# Patient Record
Sex: Male | Born: 1970 | Race: White | Hispanic: No | Marital: Married | Smoking: Never smoker
Health system: Southern US, Community
[De-identification: ages and names within clinical notes are randomized; demographics above are authoritative.]

## PROBLEM LIST (undated history)

## (undated) HISTORY — PX: CHEST SURGERY: SHX595

## (undated) HISTORY — PX: APPENDECTOMY: SHX54

---

## 2002-05-31 ENCOUNTER — Ambulatory Visit (HOSPITAL_BASED_OUTPATIENT_CLINIC_OR_DEPARTMENT_OTHER): Admission: RE | Admit: 2002-05-31 | Discharge: 2002-05-31 | Payer: Self-pay | Admitting: Orthopedic Surgery

## 2015-02-27 ENCOUNTER — Emergency Department (HOSPITAL_COMMUNITY): Payer: Self-pay

## 2015-02-27 ENCOUNTER — Encounter (HOSPITAL_COMMUNITY): Payer: Self-pay | Admitting: Emergency Medicine

## 2015-02-27 ENCOUNTER — Emergency Department (HOSPITAL_COMMUNITY)
Admission: EM | Admit: 2015-02-27 | Discharge: 2015-02-27 | Disposition: A | Discharge status: Home / Self Care | Payer: Self-pay | Attending: Emergency Medicine | Admitting: Emergency Medicine

## 2015-02-27 DIAGNOSIS — R079 Chest pain, unspecified: Secondary | ICD-10-CM | POA: Insufficient documentation

## 2015-02-27 DIAGNOSIS — Z9889 Other specified postprocedural states: Secondary | ICD-10-CM | POA: Insufficient documentation

## 2015-02-27 DIAGNOSIS — R0602 Shortness of breath: Secondary | ICD-10-CM | POA: Insufficient documentation

## 2015-02-27 LAB — CBC
HCT: 43.6 % (ref 39.0–52.0)
HEMOGLOBIN: 15 g/dL (ref 13.0–17.0)
MCH: 31.4 pg (ref 26.0–34.0)
MCHC: 34.4 g/dL (ref 30.0–36.0)
MCV: 91.2 fL (ref 78.0–100.0)
PLATELETS: 172 10*3/uL (ref 150–400)
RBC: 4.78 MIL/uL (ref 4.22–5.81)
RDW: 12.4 % (ref 11.5–15.5)
WBC: 9.2 10*3/uL (ref 4.0–10.5)

## 2015-02-27 LAB — BASIC METABOLIC PANEL
ANION GAP: 8 (ref 5–15)
BUN: 17 mg/dL (ref 6–20)
CALCIUM: 9.2 mg/dL (ref 8.9–10.3)
CO2: 27 mmol/L (ref 22–32)
CREATININE: 0.83 mg/dL (ref 0.61–1.24)
Chloride: 101 mmol/L (ref 101–111)
Glucose, Bld: 122 mg/dL — ABNORMAL HIGH (ref 65–99)
Potassium: 3.8 mmol/L (ref 3.5–5.1)
SODIUM: 136 mmol/L (ref 135–145)

## 2015-02-27 LAB — D-DIMER, QUANTITATIVE (NOT AT ARMC)

## 2015-02-27 LAB — TROPONIN I

## 2015-02-27 MED ORDER — OMEPRAZOLE 20 MG PO CPDR: 20.0000 mg | DELAYED_RELEASE_CAPSULE | Freq: Every day | ORAL | Status: DC

## 2015-02-27 NOTE — ED Notes (Signed)
Pt c/o sharp chest pain when he takes a deep breath.

## 2015-02-27 NOTE — Discharge Instructions (Signed)
Nonspecific Chest Pain  °Chest pain can be caused by many different conditions. There is always a chance that your pain could be related to something serious, such as a heart attack or a blood clot in your lungs. Chest pain can also be caused by conditions that are not life-threatening. If you have chest pain, it is very important to follow up with your health care provider. °CAUSES  °Chest pain can be caused by: °· Heartburn. °· Pneumonia or bronchitis. °· Anxiety or stress. °· Inflammation around your heart (pericarditis) or lung (pleuritis or pleurisy). °· A blood clot in your lung. °· A collapsed lung (pneumothorax). It can develop suddenly on its own (spontaneous pneumothorax) or from trauma to the chest. °· Shingles infection (varicella-zoster virus). °· Heart attack. °· Damage to the bones, muscles, and cartilage that make up your chest wall. This can include: °¨ Bruised bones due to injury. °¨ Strained muscles or cartilage due to frequent or repeated coughing or overwork. °¨ Fracture to one or more ribs. °¨ Sore cartilage due to inflammation (costochondritis). °RISK FACTORS  °Risk factors for chest pain may include: °· Activities that increase your risk for trauma or injury to your chest. °· Respiratory infections or conditions that cause frequent coughing. °· Medical conditions or overeating that can cause heartburn. °· Heart disease or family history of heart disease. °· Conditions or health behaviors that increase your risk of developing a blood clot. °· Having had chicken pox (varicella zoster). °SIGNS AND SYMPTOMS °Chest pain can feel like: °· Burning or tingling on the surface of your chest or deep in your chest. °· Crushing, pressure, aching, or squeezing pain. °· Dull or sharp pain that is worse when you move, cough, or take a deep breath. °· Pain that is also felt in your back, neck, shoulder, or arm, or pain that spreads to any of these areas. °Your chest pain may come and go, or it may stay  constant. °DIAGNOSIS °Lab tests or other studies may be needed to find the cause of your pain. Your health care provider may have you take a test called an ambulatory ECG (electrocardiogram). An ECG records your heartbeat patterns at the time the test is performed. You may also have other tests, such as: °· Transthoracic echocardiogram (TTE). During echocardiography, sound waves are used to create a picture of all of the heart structures and to look at how blood flows through your heart. °· Transesophageal echocardiogram (TEE). This is a more advanced imaging test that obtains images from inside your body. It allows your health care provider to see your heart in finer detail. °· Cardiac monitoring. This allows your health care provider to monitor your heart rate and rhythm in real time. °· Holter monitor. This is a portable device that records your heartbeat and can help to diagnose abnormal heartbeats. It allows your health care provider to track your heart activity for several days, if needed. °· Stress tests. These can be done through exercise or by taking medicine that makes your heart beat more quickly. °· Blood tests. °· Imaging tests. °TREATMENT  °Your treatment depends on what is causing your chest pain. Treatment may include: °· Medicines. These may include: °¨ Acid blockers for heartburn. °¨ Anti-inflammatory medicine. °¨ Pain medicine for inflammatory conditions. °¨ Antibiotic medicine, if an infection is present. °¨ Medicines to dissolve blood clots. °¨ Medicines to treat coronary artery disease. °· Supportive care for conditions that do not require medicines. This may include: °¨ Resting. °¨ Applying heat   or cold packs to injured areas. °¨ Limiting activities until pain decreases. °HOME CARE INSTRUCTIONS °· If you were prescribed an antibiotic medicine, finish it all even if you start to feel better. °· Avoid any activities that bring on chest pain. °· Do not use any tobacco products, including  cigarettes, chewing tobacco, or electronic cigarettes. If you need help quitting, ask your health care provider. °· Do not drink alcohol. °· Take medicines only as directed by your health care provider. °· Keep all follow-up visits as directed by your health care provider. This is important. This includes any further testing if your chest pain does not go away. °· If heartburn is the cause for your chest pain, you may be told to keep your head raised (elevated) while sleeping. This reduces the chance that acid will go from your stomach into your esophagus. °· Make lifestyle changes as directed by your health care provider. These may include: °¨ Getting regular exercise. Ask your health care provider to suggest some activities that are safe for you. °¨ Eating a heart-healthy diet. A registered dietitian can help you to learn healthy eating options. °¨ Maintaining a healthy weight. °¨ Managing diabetes, if necessary. °¨ Reducing stress. °SEEK MEDICAL CARE IF: °· Your chest pain does not go away after treatment. °· You have a rash with blisters on your chest. °· You have a fever. °SEEK IMMEDIATE MEDICAL CARE IF:  °· Your chest pain is worse. °· You have an increasing cough, or you cough up blood. °· You have severe abdominal pain. °· You have severe weakness. °· You faint. °· You have chills. °· You have sudden, unexplained chest discomfort. °· You have sudden, unexplained discomfort in your arms, back, neck, or jaw. °· You have shortness of breath at any time. °· You suddenly start to sweat, or your skin gets clammy. °· You feel nauseous or you vomit. °· You suddenly feel light-headed or dizzy. °· Your heart begins to beat quickly, or it feels like it is skipping beats. °These symptoms may represent a serious problem that is an emergency. Do not wait to see if the symptoms will go away. Get medical help right away. Call your local emergency services (911 in the [place] Do not drive yourself to the hospital. °  °This  information is not intended to replace advice given to you by your health care provider. Make sure you discuss any questions you have with your health care provider. °  °Document Released: 10/15/2004 Document Revised: 01/26/2014 Document Reviewed: 08/11/2013 °Elsevier Interactive Patient Education ©2016 [employer] ° °

## 2015-02-27 NOTE — ED Provider Notes (Signed)
CSN: [id number]     Arrival date & time 02/27/15  2009 History   First MD Initiated Contact with Patient 02/27/15 2041     Chief Complaint  Patient presents with  . Chest Pain      Patient is a 45 y.o. male presenting with chest pain. The history is provided by the patient.  Chest Pain Associated symptoms: shortness of breath   Associated symptoms: no abdominal pain, no back pain, no cough, no headache, no nausea, no numbness, not vomiting and no weakness    patient presented with chest pain. Is sharp and in the mid chest. Worse with deep breath and does radiate to the back. No fevers or chills. No cough.  Pain is not with exertion. Not worse after eating. He has had one episode like this before. It resolved on its own. He drives trucks for a living.  He does not smoke.  History reviewed. No pertinent past medical history. Past Surgical History  Procedure Laterality Date  . Chest surgery     History reviewed. No pertinent family history. Social History  Substance Use Topics  . Smoking status: Never Smoker   . Smokeless tobacco: None  . Alcohol Use: No    Review of Systems  Constitutional: Negative for activity change and appetite change.  Eyes: Negative for pain.  Respiratory: Positive for shortness of breath. Negative for cough and chest tightness.   Cardiovascular: Positive for chest pain. Negative for leg swelling.  Gastrointestinal: Negative for nausea, vomiting, abdominal pain and diarrhea.  Genitourinary: Negative for flank pain.  Musculoskeletal: Negative for back pain and neck stiffness.  Skin: Negative for rash.  Neurological: Negative for weakness, numbness and headaches.  Psychiatric/Behavioral: Negative for behavioral problems.      Allergies  Review of patient's allergies indicates no known allergies.  Home Medications   Prior to Admission medications   Medication Sig Start Date End Date Taking? Authorizing Provider  omeprazole (PRILOSEC) 20 MG capsule  Take 1 capsule (20 mg total) by mouth daily. 02/27/15   Benjiman Core, MD   BP 120/85 mmHg  Pulse 70  Temp(Src) 98.3 F (36.8 C) (Oral)  Resp 18  Ht  (1.753 m)  Wt 160 lb (72.576 kg)  BMI 23.62 kg/m2  SpO2 98% Physical Exam  Constitutional: He is oriented to person, place, and time. He appears well-developed and well-nourished.  HENT:  Head: Normocephalic and atraumatic.  Eyes: EOM are normal. Pupils are equal, round, and reactive to light.  Neck: Normal range of motion. Neck supple.  Cardiovascular: Normal rate, regular rhythm and normal heart sounds.   Pulmonary/Chest: Effort normal and breath sounds normal.  Abdominal: Soft. Bowel sounds are normal. He exhibits no distension.  Musculoskeletal: Normal range of motion.  Neurological: He is alert and oriented to person, place, and time.  Skin: Skin is warm and dry.  Psychiatric: He has a normal mood and affect.  Nursing note and vitals reviewed.   ED Course  Procedures (including critical care time) Labs Review Labs Reviewed  BASIC METABOLIC PANEL - Abnormal; Notable for the following:    Glucose, Bld 122 (*)    All other components within normal limits  CBC  TROPONIN I  D-DIMER, QUANTITATIVE (NOT AT [facility])    Imaging Review Dg Chest 2 View  02/27/2015  CLINICAL DATA:  Acute chest pain. EXAM: CHEST  2 VIEW COMPARISON:  None. FINDINGS: The heart size and mediastinal contours are within normal limits. Both lungs are clear. No pneumothorax or  pleural effusion is noted. Pectus excavatum deformity is noted. IMPRESSION: No active cardiopulmonary disease. Electronically Signed   By: Lupita Raider, M.D.   On: 02/27/2015 21:16   I have personally reviewed and evaluated these images and lab results as part of my medical decision-making.   EKG Interpretation   Date/Time:  Wednesday February 27 2015 20:16:46 EST Ventricular Rate:  81 PR Interval:  140 QRS Duration: 104 QT Interval:  384 QTC Calculation: 446 R Axis:    72 Text Interpretation:  Normal sinus rhythm Biatrial enlargement Incomplete  right bundle branch block Left ventricular hypertrophy Abnormal ECG  Confirmed by Rubin Payor  MD, Harrold Donath [id number]) on 02/27/2015 8:43:04 PM      MDM   Final diagnoses:  Chest pain, unspecified chest pain type     patient with chest pain. EKG reassuring. Troponin negative. X-ray reassuring. Negative d-dimer. Will discharge home.    Benjiman Core, MD 02/27/15 [phone]

## 2018-03-19 ENCOUNTER — Encounter (HOSPITAL_COMMUNITY): Payer: Self-pay | Admitting: Emergency Medicine

## 2018-03-19 ENCOUNTER — Emergency Department (HOSPITAL_COMMUNITY): Payer: Self-pay

## 2018-03-19 ENCOUNTER — Emergency Department (HOSPITAL_COMMUNITY)
Admission: EM | Admit: 2018-03-19 | Discharge: 2018-03-20 | Disposition: A | Discharge status: Home / Self Care | Payer: Self-pay | Attending: Emergency Medicine | Admitting: Emergency Medicine

## 2018-03-19 ENCOUNTER — Other Ambulatory Visit: Payer: Self-pay

## 2018-03-19 DIAGNOSIS — J9801 Acute bronchospasm: Secondary | ICD-10-CM | POA: Insufficient documentation

## 2018-03-19 DIAGNOSIS — R0789 Other chest pain: Secondary | ICD-10-CM | POA: Insufficient documentation

## 2018-03-19 DIAGNOSIS — J209 Acute bronchitis, unspecified: Secondary | ICD-10-CM

## 2018-03-19 LAB — CBC
HEMATOCRIT: 45.4 % (ref 39.0–52.0)
HEMOGLOBIN: 15 g/dL (ref 13.0–17.0)
MCH: 29.8 pg (ref 26.0–34.0)
MCHC: 33 g/dL (ref 30.0–36.0)
MCV: 90.1 fL (ref 80.0–100.0)
NRBC: 0 % (ref 0.0–0.2)
PLATELETS: 221 10*3/uL (ref 150–400)
RBC: 5.04 MIL/uL (ref 4.22–5.81)
RDW: 12.6 % (ref 11.5–15.5)
WBC: 6.9 10*3/uL (ref 4.0–10.5)

## 2018-03-19 LAB — BASIC METABOLIC PANEL
ANION GAP: 8 (ref 5–15)
BUN: 15 mg/dL (ref 6–20)
CHLORIDE: 103 mmol/L (ref 98–111)
CO2: 27 mmol/L (ref 22–32)
Calcium: 9 mg/dL (ref 8.9–10.3)
Creatinine, Ser: 0.83 mg/dL (ref 0.61–1.24)
GFR calc non Af Amer: >60 mL/min (ref >60)
Glucose, Bld: 102 mg/dL — ABNORMAL HIGH (ref 70–99)
POTASSIUM: 3.6 mmol/L (ref 3.5–5.1)
Sodium: 138 mmol/L (ref 135–145)

## 2018-03-19 LAB — TROPONIN I: Troponin I: <0.03 ng/mL (ref <0.03)

## 2018-03-19 MED ORDER — LORATADINE 10 MG PO TABS: 10.0000 mg | ORAL_TABLET | Freq: Every day | ORAL | 0 refills | Status: DC

## 2018-03-19 MED ORDER — ALBUTEROL (5 MG/ML) CONTINUOUS INHALATION SOLN
10.0000 mg/h | INHALATION_SOLUTION | RESPIRATORY_TRACT | Status: DC
  Administered 2018-03-19: 10 mg/h via RESPIRATORY_TRACT
  Filled 2018-03-19: qty 20

## 2018-03-19 MED ORDER — PREDNISONE 20 MG PO TABS: ORAL_TABLET | ORAL | 0 refills | Status: DC

## 2018-03-19 MED ORDER — HYDROCODONE-ACETAMINOPHEN 5-325 MG PO TABS: 1.0000 | ORAL_TABLET | Freq: Four times a day (QID) | ORAL | 0 refills | Status: AC | PRN

## 2018-03-19 MED ORDER — PREDNISONE 50 MG PO TABS
60.0000 mg | ORAL_TABLET | Freq: Once | ORAL | Status: AC
Start: 2018-03-19 — End: 2018-03-19
  Administered 2018-03-19: 60 mg via ORAL
  Filled 2018-03-19: qty 1

## 2018-03-19 MED ORDER — IBUPROFEN 600 MG PO TABS: 600.0000 mg | ORAL_TABLET | Freq: Four times a day (QID) | ORAL | 0 refills | Status: AC | PRN

## 2018-03-19 MED ORDER — KETOROLAC TROMETHAMINE 60 MG/2ML IM SOLN
60.0000 mg | Freq: Once | INTRAMUSCULAR | Status: AC
  Administered 2018-03-19: 60 mg via INTRAMUSCULAR
  Filled 2018-03-19: qty 2

## 2018-03-19 MED ORDER — FLUTICASONE PROPIONATE 50 MCG/ACT NA SUSP: 2.0000 | Freq: Every day | NASAL | 0 refills | Status: AC

## 2018-03-19 MED ORDER — LEVOFLOXACIN 750 MG PO TABS
750.0000 mg | ORAL_TABLET | Freq: Once | ORAL | Status: AC
  Administered 2018-03-19: 750 mg via ORAL
  Filled 2018-03-19: qty 1

## 2018-03-19 MED ORDER — LEVOFLOXACIN 750 MG PO TABS: 750.0000 mg | ORAL_TABLET | Freq: Every day | ORAL | 0 refills | Status: DC

## 2018-03-19 MED ORDER — ALBUTEROL SULFATE HFA 108 (90 BASE) MCG/ACT IN AERS
1.0000 | INHALATION_SPRAY | RESPIRATORY_TRACT | Status: DC | PRN
  Administered 2018-03-19: 1 via RESPIRATORY_TRACT
  Filled 2018-03-19: qty 6.7

## 2018-03-19 MED ORDER — HYDROCODONE-ACETAMINOPHEN 5-325 MG PO TABS
1.0000 | ORAL_TABLET | Freq: Once | ORAL | Status: AC
  Administered 2018-03-19: 1 via ORAL
  Filled 2018-03-19: qty 1

## 2018-03-19 MED ORDER — SODIUM CHLORIDE 0.9% FLUSH
3.0000 mL | Freq: Once | INTRAVENOUS | Status: AC
  Administered 2018-03-19: 3 mL via INTRAVENOUS

## 2018-03-19 MED ORDER — ALBUTEROL SULFATE (2.5 MG/3ML) 0.083% IN NEBU
5.0000 mg | INHALATION_SOLUTION | Freq: Once | RESPIRATORY_TRACT | Status: AC
  Administered 2018-03-19: 5 mg via RESPIRATORY_TRACT
  Filled 2018-03-19: qty 6

## 2018-03-19 NOTE — ED Triage Notes (Signed)
Pt reports he has had a cough and chest congestion x 1 week, this am pt began having sharp pain to right chest with coughing and deep breathing

## 2018-03-19 NOTE — ED Notes (Signed)
Pt returned from xray,  

## 2018-03-19 NOTE — ED Provider Notes (Signed)
[facility] EMERGENCY DEPARTMENT Provider Note   CSN: [id number] Arrival date & time: 03/19/18  1938    History   Chief Complaint Chief Complaint  Patient presents with  . Chest Pain    HPI Scott Camacho is a 48 y.o. male.     HPI Patient presents with 4 weeks of nasal congestion, sinus pressure, intermittent ear pressure and nonproductive cough.  For the past 4 days he has had intermittent chills and fever.  Developed right-sided chest pain over the last week which she associates with coughing.  Worse with coughing, deep breathing and palpation.  No lower extremity swelling or pain. History reviewed. No pertinent past medical history.  There are no active problems to display for this patient.   Past Surgical History:  Procedure Laterality Date  . APPENDECTOMY    . CHEST SURGERY          Home Medications    Prior to Admission medications   Medication Sig Start Date End Date Taking? Authorizing Provider  fluticasone (FLONASE) 50 MCG/ACT nasal spray Place 2 sprays into both nostrils daily. 03/19/18   Loren Racer, MD  HYDROcodone-acetaminophen (NORCO) 5-325 MG tablet Take 1 tablet by mouth every 6 (six) hours as needed for severe pain. 03/19/18   Loren Racer, MD  ibuprofen (ADVIL,MOTRIN) 600 MG tablet Take 1 tablet (600 mg total) by mouth every 6 (six) hours as needed. 03/19/18   Loren Racer, MD  levofloxacin (LEVAQUIN) 750 MG tablet Take 1 tablet (750 mg total) by mouth daily. X 7 days 03/20/18   Loren Racer, MD  loratadine (CLARITIN) 10 MG tablet Take 1 tablet (10 mg total) by mouth daily. 03/19/18   Loren Racer, MD  predniSONE (DELTASONE) 20 MG tablet 3 tabs po day one, then 2 po daily x 4 days 03/20/18   Loren Racer, MD    Family History History reviewed. No pertinent family history.  Social History Social History   Tobacco Use  . Smoking status: Never Smoker  . Smokeless tobacco: Never Used  Substance Use Topics  . Alcohol use: No  .  Drug use: No     Allergies   Patient has no known allergies.   Review of Systems Review of Systems  Constitutional: Positive for chills and fever.  HENT: Positive for congestion, ear pain, sinus pressure and sore throat.   Respiratory: Positive for cough, shortness of breath and wheezing.   Cardiovascular: Positive for chest pain. Negative for palpitations and leg swelling.  Gastrointestinal: Negative for abdominal pain, constipation, diarrhea, nausea and vomiting.  Genitourinary: Negative for flank pain, frequency and hematuria.  Musculoskeletal: Negative for back pain, myalgias and neck pain.  Skin: Negative for rash and wound.  Neurological: Positive for light-headedness. Negative for dizziness, syncope, weakness, numbness and headaches.  All other systems reviewed and are negative.    Physical Exam Updated Vital Signs BP 137/76   Pulse 99   Temp 99 F (37.2 C) (Oral)   Resp 20   Ht 6' (1.829 m)   Wt 76.7 kg   SpO2 98%   BMI 22.92 kg/m   Physical Exam Vitals signs and nursing note reviewed.  Constitutional:      Appearance: Normal appearance. He is well-developed.  HENT:     Head: Normocephalic and atraumatic.     Right Ear: Tympanic membrane normal.     Left Ear: Tympanic membrane normal.     Nose: Congestion present.     Mouth/Throat:     Mouth: Mucous membranes  are moist.     Pharynx: No oropharyngeal exudate or posterior oropharyngeal erythema.  Eyes:     Extraocular Movements: Extraocular movements intact.     Pupils: Pupils are equal, round, and reactive to light.  Neck:     Musculoskeletal: Normal range of motion and neck supple. No neck rigidity or muscular tenderness.     Vascular: No carotid bruit.  Cardiovascular:     Rate and Rhythm: Normal rate and regular rhythm.     Heart sounds: No murmur. No friction rub. No gallop.   Pulmonary:     Effort: Pulmonary effort is normal.     Breath sounds: Wheezing present.     Comments: Patient with  diffuse expiratory wheezes.  Right inferior chest wall tenderness to palpation.  No crepitance or deformity. Chest:     Chest wall: Tenderness present.  Abdominal:     General: Bowel sounds are normal. There is no distension.     Palpations: Abdomen is soft. There is no mass.     Tenderness: There is no abdominal tenderness. There is no right CVA tenderness, left CVA tenderness, guarding or rebound.     Hernia: No hernia is present.  Musculoskeletal: Normal range of motion.        General: No swelling, tenderness, deformity or signs of injury.     Right lower leg: No edema.     Left lower leg: No edema.  Lymphadenopathy:     Cervical: No cervical adenopathy.  Skin:    General: Skin is warm and dry.     Capillary Refill: Capillary refill takes less than 2 seconds.     Findings: No erythema or rash.  Neurological:     General: No focal deficit present.     Mental Status: He is alert and oriented to person, place, and time.  Psychiatric:        Mood and Affect: Mood normal.        Behavior: Behavior normal.      ED Treatments / Results  Labs (all labs ordered are listed, but only abnormal results are displayed) Labs Reviewed  BASIC METABOLIC PANEL - Abnormal; Notable for the following components:      Result Value   Glucose, Bld 102 (*)    All other components within normal limits  CBC  TROPONIN I    EKG EKG Interpretation  Date/Time:  Saturday March 19 2018 19:46:11 EST Ventricular Rate:  82 PR Interval:    QRS Duration: 111 QT Interval:  390 QTC Calculation: 456 R Axis:   74 Text Interpretation:  Sinus rhythm Biatrial enlargement RSR' in V1 or V2, right VCD or RVH Left ventricular hypertrophy Nonspecific T abnormalities, anterior leads Confirmed by Loren Racer ([id number]) on 03/19/2018 9:43:40 PM   Radiology Dg Chest 2 View  Result Date: 03/19/2018 CLINICAL DATA:  Cough and chest congestion for 1 week. Sharp right chest pain with cough and deep breathing.  EXAM: CHEST - 2 VIEW COMPARISON:  02/27/2015 FINDINGS: Normal heart size with normal pulmonary vascularity. Surgical clips projected over the right cardiophrenic angle region. No change. Lungs are clear and expanded. No blunting of costophrenic angles. No pneumothorax. Mediastinal contours appear intact. Pectus excavatum deformity. IMPRESSION: No active cardiopulmonary disease.  Pectus excavatum.  No change. Electronically Signed   By: Burman Nieves M.D.   On: 03/19/2018 20:52    Procedures Procedures (including critical care time)  Medications Ordered in ED Medications  sodium chloride flush (NS) 0.9 % injection 3 mL (3  mLs Intravenous Given by Other 03/19/18 2011)  albuterol (PROVENTIL) (2.5 MG/3ML) 0.083% nebulizer solution 5 mg (5 mg Nebulization Given 03/19/18 2045)  predniSONE (DELTASONE) tablet 60 mg (60 mg Oral Given 03/19/18 2022)  HYDROcodone-acetaminophen (NORCO/VICODIN) 5-325 MG per tablet 1 tablet (1 tablet Oral Given 03/19/18 2022)  ketorolac (TORADOL) injection 60 mg (60 mg Intramuscular Given 03/19/18 2222)  levofloxacin (LEVAQUIN) tablet 750 mg (750 mg Oral Given 03/19/18 2352)     Initial Impression / Assessment and Plan / ED Course  I have reviewed the triage vital signs and the nursing notes.  Pertinent labs & imaging results that were available during my care of the patient were reviewed by me and considered in my medical decision making (see chart for details).       X-ray without acute findings.  Patient has some improvement in his wheezing after initial breathing treatment though still present.  Will give continuous neb treatment.  Wheezing is significant improved after continuous nebulized treatment.  Patient states he is feeling much better.  Will treat for bronchitis with bronchospasm as well as likely sinusitis.  Chest pain appears to be musculoskeletal in nature.  Low suspicion for PE.  Given albuterol inhaler in the emergency department will discharge with short  course of steroids as well as antibiotics.  Return precautions given. Final Clinical Impressions(s) / ED Diagnoses   Final diagnoses:  Bronchitis with bronchospasm  Right-sided chest wall pain    ED Discharge Orders         Ordered    predniSONE (DELTASONE) 20 MG tablet     03/19/18 2339    levofloxacin (LEVAQUIN) 750 MG tablet  Daily     03/19/18 2339    HYDROcodone-acetaminophen (NORCO) 5-325 MG tablet  Every 6 hours PRN     03/19/18 2339    ibuprofen (ADVIL,MOTRIN) 600 MG tablet  Every 6 hours PRN     03/19/18 2339    fluticasone (FLONASE) 50 MCG/ACT nasal spray  Daily     03/19/18 2339    loratadine (CLARITIN) 10 MG tablet  Daily     03/19/18 2339           Loren Racer, MD 03/20/18 1558

## 2018-03-23 ENCOUNTER — Ambulatory Visit (INDEPENDENT_AMBULATORY_CARE_PROVIDER_SITE_OTHER): Payer: Self-pay

## 2018-03-23 ENCOUNTER — Encounter: Payer: Self-pay | Admitting: Physician Assistant

## 2018-03-23 ENCOUNTER — Ambulatory Visit: Payer: Self-pay | Admitting: Physician Assistant

## 2018-03-23 VITALS — BP 137/81 | HR 82 | Temp 97.1°F | Ht 72.0 in | Wt 169.2 lb

## 2018-03-23 DIAGNOSIS — R0781 Pleurodynia: Secondary | ICD-10-CM

## 2018-03-23 DIAGNOSIS — J209 Acute bronchitis, unspecified: Secondary | ICD-10-CM

## 2018-03-23 DIAGNOSIS — R05 Cough: Secondary | ICD-10-CM

## 2018-03-23 DIAGNOSIS — R059 Cough, unspecified: Secondary | ICD-10-CM

## 2018-03-23 MED ORDER — PREDNISONE 10 MG (48) PO TBPK: ORAL_TABLET | ORAL | 0 refills | Status: DC

## 2018-03-23 MED ORDER — LEVOFLOXACIN 750 MG PO TABS: 750.0000 mg | ORAL_TABLET | Freq: Every day | ORAL | 0 refills | Status: DC

## 2018-03-23 MED ORDER — LORATADINE 10 MG PO TABS: 10.0000 mg | ORAL_TABLET | Freq: Every day | ORAL | 3 refills | Status: AC

## 2018-03-24 NOTE — Progress Notes (Signed)
BP 137/81   Pulse 82   Temp (!) 97.1 F (36.2 C) (Oral)   Ht 6' (1.829 m)   Wt 169 lb 3.2 oz (76.7 kg)   BMI 22.95 kg/m    Subjective:    Patient ID: Scott Camacho, male    DOB: Dec 26, 1970, 48 y.o.   MRN: [id number]  HPI: Scott Camacho is a 48 y.o. male presenting on 03/23/2018 for New Patient (Initial Visit); Cough; and Flank Pain (right)  Patient with several days of progressing upper respiratory and bronchial symptoms. Initially there was more upper respiratory congestion. This progressed to having significant cough that is productive throughout the day and severe at night. There is occasional wheezing after coughing. Sometimes there is slight dyspnea on exertion. It is productive mucus that is yellow in color. Denies any blood.   History reviewed. No pertinent past medical history. Relevant past medical, surgical, family and social history reviewed and updated as indicated. Interim medical history since our last visit reviewed. Allergies and medications reviewed and updated. DATA REVIEWED: CHART IN EPIC  Family History reviewed for pertinent findings.  Review of Systems  Constitutional: Positive for activity change, fatigue and fever. Negative for appetite change.  HENT: Positive for congestion and sore throat. Negative for sinus pressure.   Eyes: Negative.  Negative for pain and visual disturbance.  Respiratory: Positive for cough and wheezing. Negative for chest tightness and shortness of breath.   Cardiovascular: Negative.  Negative for chest pain, palpitations and leg swelling.  Gastrointestinal: Positive for nausea. Negative for abdominal pain, diarrhea and vomiting.  Endocrine: Negative.   Genitourinary: Negative.   Musculoskeletal: Positive for back pain and myalgias. Negative for arthralgias.  Skin: Negative.  Negative for color change and rash.  Neurological: Positive for headaches. Negative for weakness and numbness.  Psychiatric/Behavioral: Negative.      Allergies as of 03/23/2018   No Known Allergies     Medication List       Accurate as of March 23, 2018 11:59 PM. Always use your most recent med list.        albuterol 108 (90 Base) MCG/ACT inhaler Commonly known as:  PROVENTIL HFA;VENTOLIN HFA Inhale into the lungs every 6 (six) hours as needed for wheezing or shortness of breath.   fluticasone 50 MCG/ACT nasal spray Commonly known as:  FLONASE Place 2 sprays into both nostrils daily.   HYDROcodone-acetaminophen 5-325 MG tablet Commonly known as:  Norco Take 1 tablet by mouth every 6 (six) hours as needed for severe pain.   ibuprofen 600 MG tablet Commonly known as:  ADVIL,MOTRIN Take 1 tablet (600 mg total) by mouth every 6 (six) hours as needed.   levofloxacin 750 MG tablet Commonly known as:  Levaquin Take 1 tablet (750 mg total) by mouth daily. X 7 days   loratadine 10 MG tablet Commonly known as:  CLARITIN Take 1 tablet (10 mg total) by mouth daily.   predniSONE 10 MG (48) Tbpk tablet Commonly known as:  STERAPRED UNI-PAK 48 TAB Take as directed for 12 days          Objective:    BP 137/81   Pulse 82   Temp (!) 97.1 F (36.2 C) (Oral)   Ht 6' (1.829 m)   Wt 169 lb 3.2 oz (76.7 kg)   BMI 22.95 kg/m   No Known Allergies  Wt Readings from Last 3 Encounters:  03/23/18 169 lb 3.2 oz (76.7 kg)  03/19/18 169 lb (76.7 kg)  02/27/15  160 lb (72.6 kg)    Physical Exam Constitutional:      Appearance: He is well-developed.  HENT:     Head: Normocephalic and atraumatic.     Right Ear: Hearing and tympanic membrane normal.     Left Ear: Hearing and tympanic membrane normal.     Nose: Mucosal edema present. No nasal deformity.     Right Sinus: Frontal sinus tenderness present.     Left Sinus: Frontal sinus tenderness present.     Mouth/Throat:     Pharynx: Posterior oropharyngeal erythema present.  Eyes:     General:        Right eye: No discharge.        Left eye: No discharge.      Conjunctiva/sclera: Conjunctivae normal.     Pupils: Pupils are equal, round, and reactive to light.  Neck:     Musculoskeletal: Normal range of motion and neck supple.  Cardiovascular:     Rate and Rhythm: Normal rate and regular rhythm.     Heart sounds: Normal heart sounds.  Pulmonary:     Effort: Pulmonary effort is normal. No respiratory distress.     Breath sounds: Wheezing present. No decreased breath sounds, rhonchi or rales.  Abdominal:     General: Bowel sounds are normal.     Palpations: Abdomen is soft.  Musculoskeletal: Normal range of motion.  Skin:    General: Skin is warm and dry.     Results for orders placed or performed during the hospital encounter of 03/19/18  Basic metabolic panel  Result Value Ref Range   Sodium 138 135 - 145 mmol/L   Potassium 3.6 3.5 - 5.1 mmol/L   Chloride 103 98 - 111 mmol/L   CO2 27 22 - 32 mmol/L   Glucose, Bld 102 (H) 70 - 99 mg/dL   BUN 15 6 - 20 mg/dL   Creatinine, Ser 5.40 0.61 - 1.24 mg/dL   Calcium 9.0 8.9 - 98.1 mg/dL   GFR calc non Af Amer >60 >60 mL/min   GFR calc Af Amer >60 >60 mL/min   Anion gap 8 5 - 15  CBC  Result Value Ref Range   WBC 6.9 4.0 - 10.5 K/uL   RBC 5.04 4.22 - 5.81 MIL/uL   Hemoglobin 15.0 13.0 - 17.0 g/dL   HCT 19.1 47.8 - 29.5 %   MCV 90.1 80.0 - 100.0 fL   MCH 29.8 26.0 - 34.0 pg   MCHC 33.0 30.0 - 36.0 g/dL   RDW 62.1 30.8 - 65.7 %   Platelets 221 150 - 400 K/uL   nRBC 0.0 0.0 - 0.2 %  Troponin I - ONCE - STAT  Result Value Ref Range   Troponin I <0.03 <0.03 ng/mL      Assessment & Plan:   1. Cough - albuterol (PROVENTIL HFA;VENTOLIN HFA) 108 (90 Base) MCG/ACT inhaler; Inhale into the lungs every 6 (six) hours as needed for wheezing or shortness of breath. - DG Ribs Unilateral Right; Future - levofloxacin (LEVAQUIN) 750 MG tablet; Take 1 tablet (750 mg total) by mouth daily. X 7 days  Dispense: 7 tablet; Refill: 0 - predniSONE (STERAPRED UNI-PAK 48 TAB) 10 MG (48) TBPK tablet; Take as  directed for 12 days  Dispense: 48 tablet; Refill: 0  2. Rib pain - DG Ribs Unilateral Right; Future  3. Bronchitis, acute, with bronchospasm - levofloxacin (LEVAQUIN) 750 MG tablet; Take 1 tablet (750 mg total) by mouth daily. X 7 days  Dispense: 7 tablet; Refill: 0 - predniSONE (STERAPRED UNI-PAK 48 TAB) 10 MG (48) TBPK tablet; Take as directed for 12 days  Dispense: 48 tablet; Refill: 0   Continue all other maintenance medications as listed above.  Follow up plan: No follow-ups on file.  Educational handout given for survey  Remus Loffler PA-C [facility] [address]  [place], [place] [address] [phone]   03/24/2018, 9:59 PM

## 2018-03-31 ENCOUNTER — Ambulatory Visit: Payer: Self-pay | Admitting: Family

## 2018-04-14 ENCOUNTER — Other Ambulatory Visit: Payer: Self-pay

## 2018-04-14 ENCOUNTER — Telehealth (INDEPENDENT_AMBULATORY_CARE_PROVIDER_SITE_OTHER): Payer: Self-pay | Admitting: Physician Assistant

## 2018-04-14 DIAGNOSIS — R059 Cough, unspecified: Secondary | ICD-10-CM

## 2018-04-14 DIAGNOSIS — R05 Cough: Secondary | ICD-10-CM

## 2018-04-14 DIAGNOSIS — J209 Acute bronchitis, unspecified: Secondary | ICD-10-CM

## 2018-04-14 MED ORDER — LEVOFLOXACIN 750 MG PO TABS: 750.0000 mg | ORAL_TABLET | Freq: Every day | ORAL | 0 refills | Status: AC

## 2018-04-14 NOTE — Progress Notes (Signed)
Telephone visit  Subjective: CC:2 PCP: Remus Loffler, PA-C Scott Camacho is a 48 y.o. male calls for telephone consult today. Patient provides verbal consent for consult held via phone.  Location of patient: home Location of provider: [facility] Others present for call: wife  This is a 3-week recheck after the patient had had a significant bronchitis.  He had even been to the emergency room before he came to see me.  He has greatly improved since taking the Levaquin with inhalers and the prednisone.  He is still using his albuterol 4 times a day.  We have instructed him to start tapering it down at this point.  But to carry around with him over the next few weeks.  He might be more apt to have bronchospasm and the cough happen again since he had been so sick.  He has been out of work from 3 4 through this coming Sunday will be 04/17/2018.  He may return to work on 04/18/2018.   ROS: Per HPI  No Known Allergies No past medical history on file.  Current Outpatient Medications:  .  albuterol (PROVENTIL HFA;VENTOLIN HFA) 108 (90 Base) MCG/ACT inhaler, Inhale into the lungs every 6 (six) hours as needed for wheezing or shortness of breath., Disp: , Rfl:  .  fluticasone (FLONASE) 50 MCG/ACT nasal spray, Place 2 sprays into both nostrils daily., Disp: 16 g, Rfl: 0 .  HYDROcodone-acetaminophen (NORCO) 5-325 MG tablet, Take 1 tablet by mouth every 6 (six) hours as needed for severe pain., Disp: 10 tablet, Rfl: 0 .  ibuprofen (ADVIL,MOTRIN) 600 MG tablet, Take 1 tablet (600 mg total) by mouth every 6 (six) hours as needed., Disp: 30 tablet, Rfl: 0 .  levofloxacin (LEVAQUIN) 750 MG tablet, Take 1 tablet (750 mg total) by mouth daily. X 7 days, Disp: 7 tablet, Rfl: 0 .  loratadine (CLARITIN) 10 MG tablet, Take 1 tablet (10 mg total) by mouth daily., Disp: 90 tablet, Rfl: 3 .  predniSONE (STERAPRED UNI-PAK 48 TAB) 10 MG (48) TBPK tablet, Take as directed for 12 days, Disp: 48 tablet, Rfl: 0  Assessment/  Plan: 48 y.o. male   1. Cough - levofloxacin (LEVAQUIN) 750 MG tablet; Take 1 tablet (750 mg total) by mouth daily. X 7 days  Dispense: 10 tablet; Refill: 0  2. Bronchitis, acute, with bronchospasm - levofloxacin (LEVAQUIN) 750 MG tablet; Take 1 tablet (750 mg total) by mouth daily. X 7 days  Dispense: 10 tablet; Refill: 0   Start time: 2:19 pm End time: 2/25 pm  No orders of the defined types were placed in this encounter.  Out of work 3/4-3/29/20  Prudy Feeler PA-C [facility] [phone]

## 2018-05-26 ENCOUNTER — Telehealth: Payer: Self-pay | Admitting: Physician Assistant

## 2020-03-28 IMAGING — DX DG RIBS 2V*R*
3 series · 3 of 3 positions shown · non-contrast
Comparison: Chest x-rays dated 03/19/2018 and 02/27/2015

CLINICAL DATA: Right rib pain.  Cough.

EXAM:
RIGHT RIBS - 2 VIEW

[rib obl (1 of 2)]
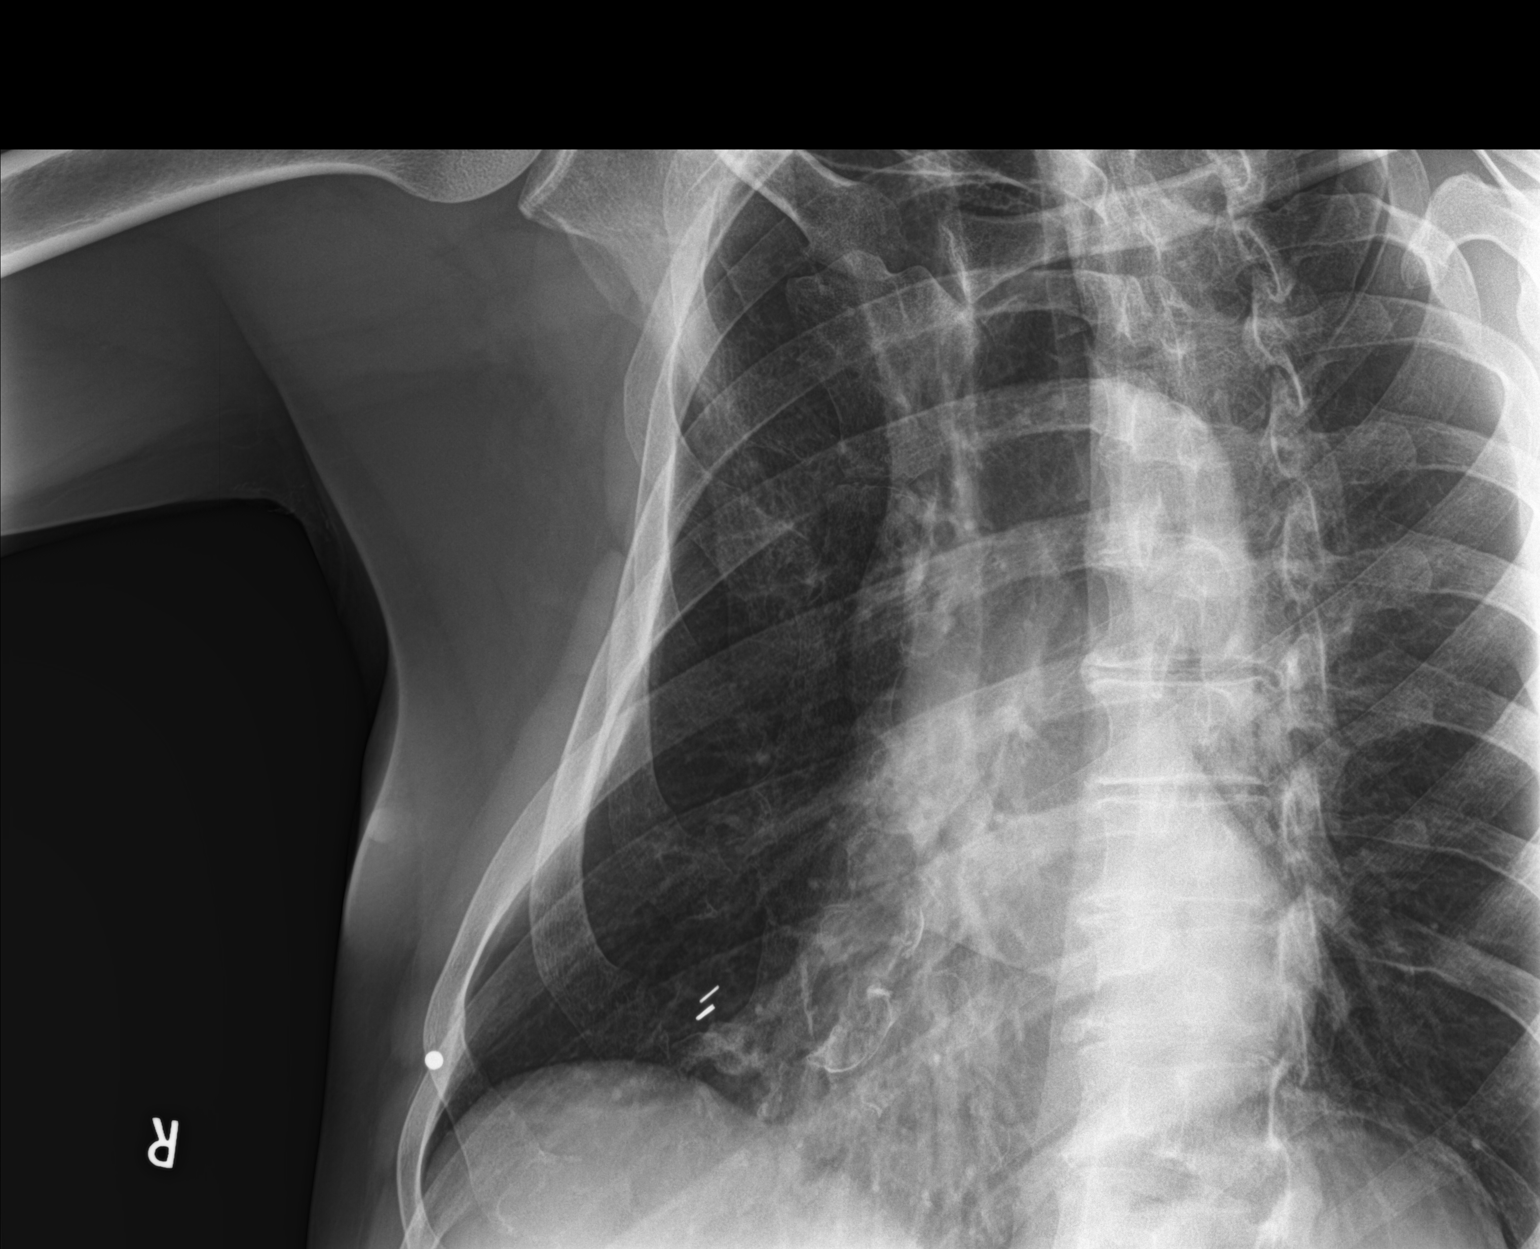

[rib obl (2 of 2)]
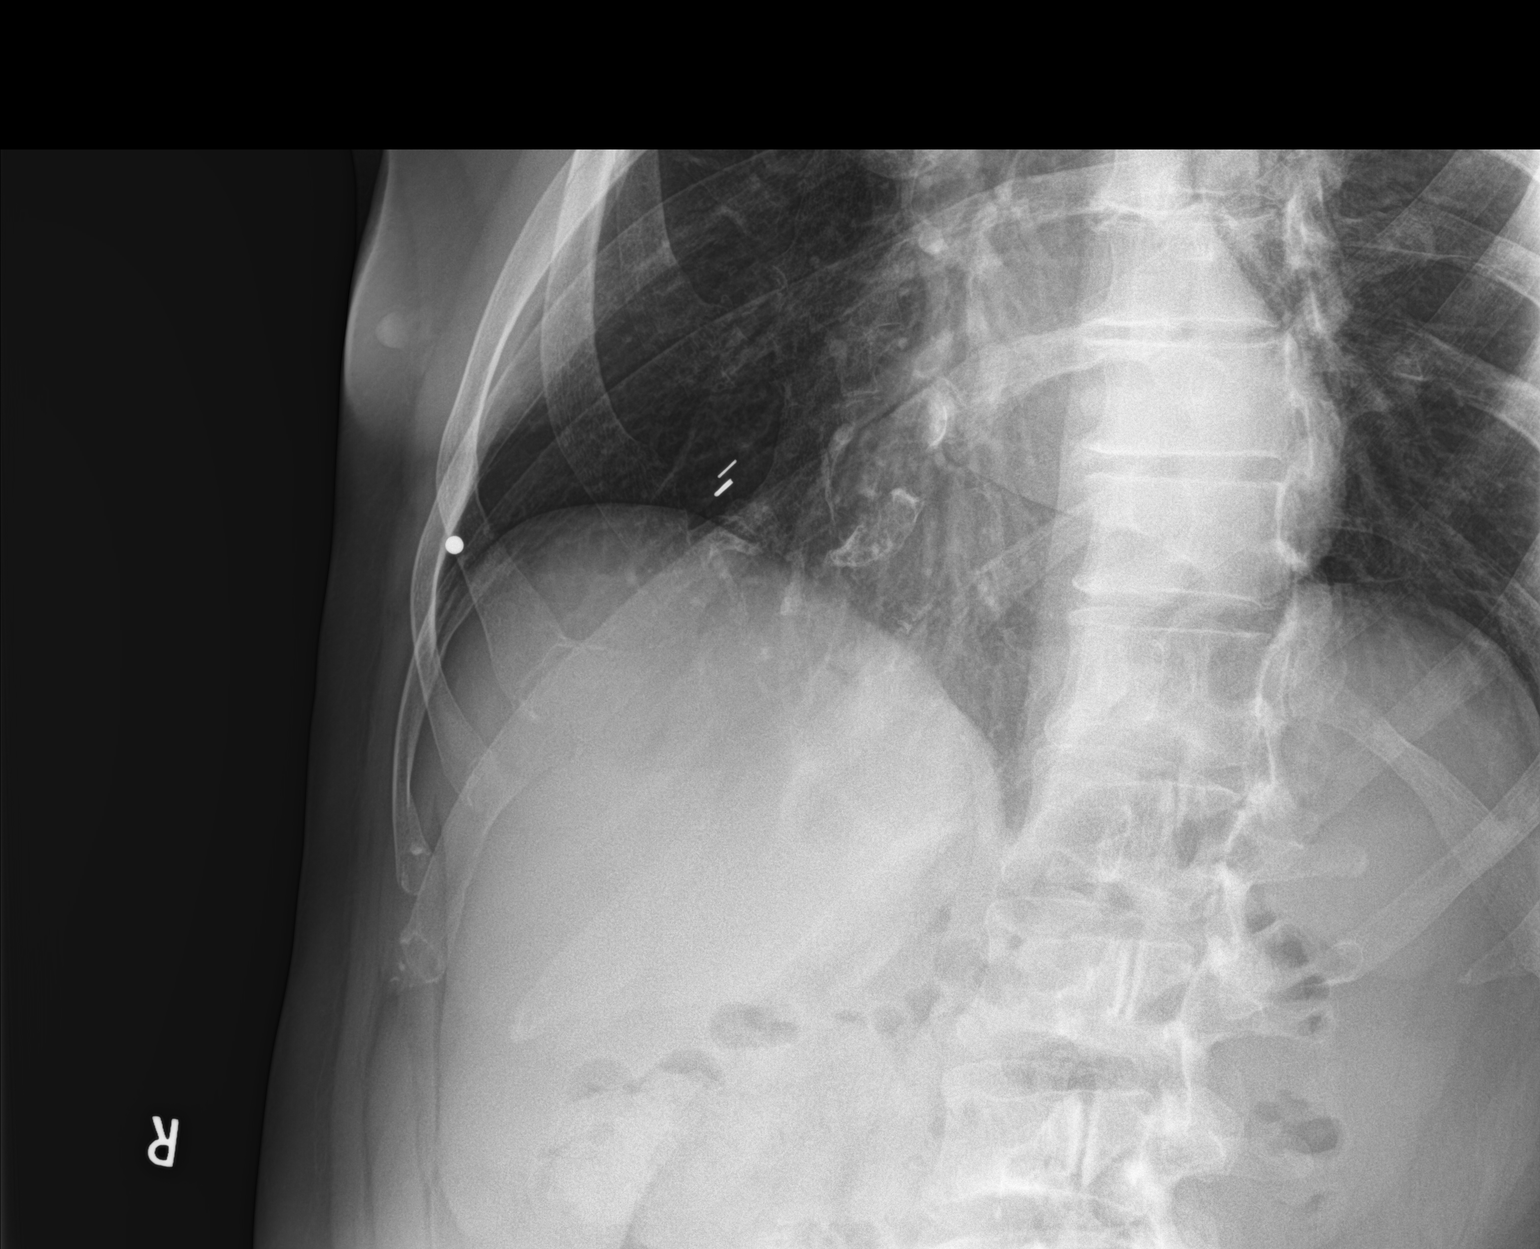

[chest pa]
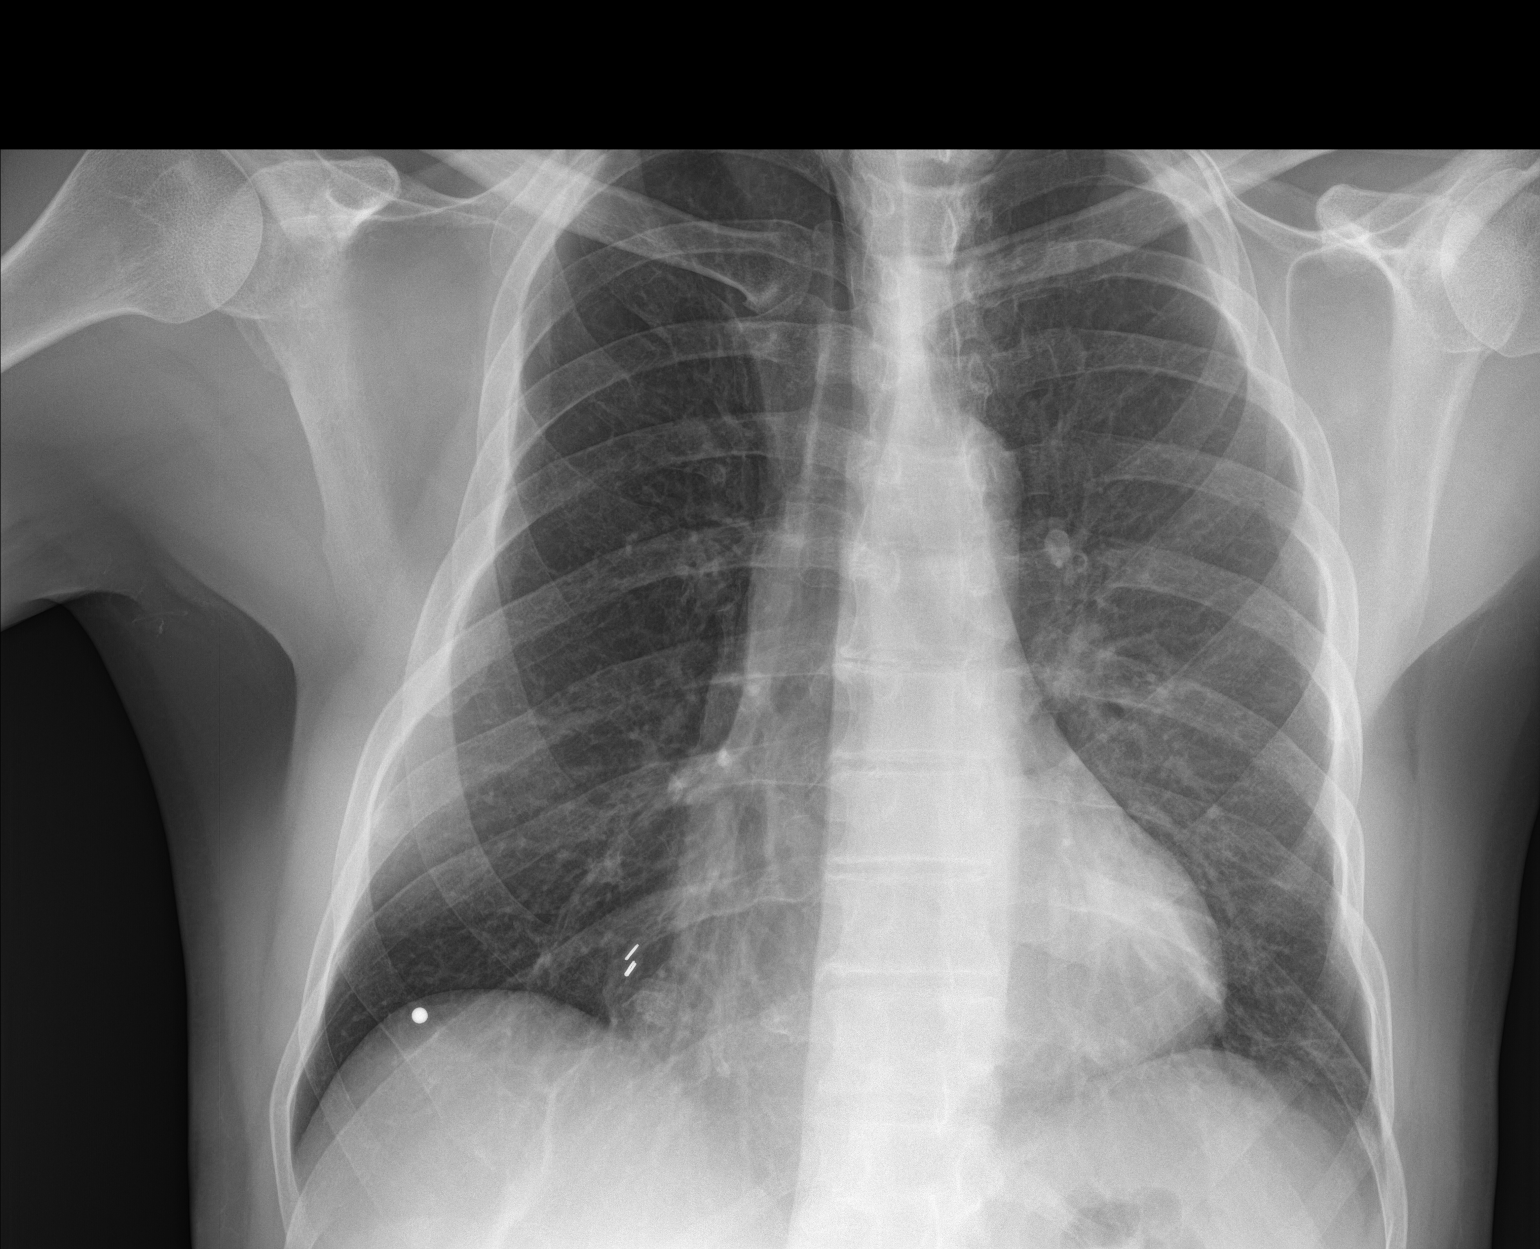

[3 of 3 positions shown; findings below may reference images not displayed]

FINDINGS: No fracture or other bone lesions are seen involving the ribs.
IMPRESSION: Negative.

## 2020-08-12 ENCOUNTER — Ambulatory Visit: Payer: Self-pay

## 2020-08-13 ENCOUNTER — Ambulatory Visit: Payer: Self-pay
